# Patient Record
Sex: Female | Born: 1990 | Race: Black or African American | Hispanic: No | Marital: Single
Health system: Southern US, Community
[De-identification: ages and names within clinical notes are randomized; demographics above are authoritative.]

## PROBLEM LIST (undated history)

## (undated) DIAGNOSIS — J45909 Unspecified asthma, uncomplicated: Secondary | ICD-10-CM

## (undated) HISTORY — PX: ANTERIOR CRUCIATE LIGAMENT REPAIR: SHX115

---

## 2013-02-04 ENCOUNTER — Emergency Department (HOSPITAL_COMMUNITY)

## 2013-02-04 ENCOUNTER — Encounter (HOSPITAL_COMMUNITY): Payer: Self-pay | Admitting: Emergency Medicine

## 2013-02-04 ENCOUNTER — Emergency Department (HOSPITAL_COMMUNITY)
Admission: EM | Admit: 2013-02-04 | Discharge: 2013-02-04 | Disposition: A | Discharge status: Home / Self Care | Attending: Emergency Medicine | Admitting: Emergency Medicine

## 2013-02-04 DIAGNOSIS — Z79899 Other long term (current) drug therapy: Secondary | ICD-10-CM | POA: Insufficient documentation

## 2013-02-04 DIAGNOSIS — Z3202 Encounter for pregnancy test, result negative: Secondary | ICD-10-CM | POA: Insufficient documentation

## 2013-02-04 DIAGNOSIS — J45909 Unspecified asthma, uncomplicated: Secondary | ICD-10-CM | POA: Insufficient documentation

## 2013-02-04 DIAGNOSIS — R192 Visible peristalsis: Secondary | ICD-10-CM

## 2013-02-04 DIAGNOSIS — R109 Unspecified abdominal pain: Secondary | ICD-10-CM | POA: Insufficient documentation

## 2013-02-04 HISTORY — DX: Unspecified asthma, uncomplicated: J45.909

## 2013-02-04 LAB — URINALYSIS, ROUTINE W REFLEX MICROSCOPIC
Bilirubin Urine: NEGATIVE
Glucose, UA: NEGATIVE mg/dL
Nitrite: NEGATIVE
Protein, ur: NEGATIVE mg/dL
Specific Gravity, Urine: 1.01 (ref 1.005–1.030)
pH: 6 (ref 5.0–8.0)

## 2013-02-04 LAB — URINE MICROSCOPIC-ADD ON

## 2013-02-04 MED ORDER — DICYCLOMINE HCL 10 MG PO CAPS
20.0000 mg | ORAL_CAPSULE | Freq: Once | ORAL | Status: AC
  Administered 2013-02-04: 20 mg via ORAL
  Filled 2013-02-04: qty 2

## 2013-02-04 MED ORDER — DICYCLOMINE HCL 20 MG PO TABS: 20.0000 mg | ORAL_TABLET | Freq: Four times a day (QID) | ORAL | Status: AC | PRN

## 2013-02-04 NOTE — ED Notes (Signed)
Per pt, she feels like something is moving around in her stomach.  Sts this started last night.  Denies nvd, sob, abdominal pain but reports cramping.  Sts she has not been passing gas as much.

## 2013-02-04 NOTE — ED Provider Notes (Signed)
CSN: [id number]     Arrival date & time 02/04/13  0330 History   First MD Initiated Contact with Patient 02/04/13 [phone]     Chief Complaint  Patient presents with  . Abdominal Cramping   (Consider location/radiation/quality/duration/timing/severity/associated sxs/prior Treatment) HPI 22 yo female presents to the ER from home with complaint of the sensation of something crawling in her stomach, and mild abdominal cramping.  No urinary symptoms, no vaginal discharge, no n/v/d.  Normal bm yesterday.  No prior abdominal surgeries.  No travel.  LMP one week ago Past Medical History  Diagnosis Date  . Asthma    Past Surgical History  Procedure Laterality Date  . Anterior cruciate ligament repair     History reviewed. No pertinent family history. History  Substance Use Topics  . Smoking status: Unknown If Ever Smoked  . Smokeless tobacco: Not on file  . Alcohol Use: No   OB History   Grav Para Term Preterm Abortions TAB SAB Ect Mult Living                 Review of Systems  All other systems reviewed and are negative.    Allergies  Shellfish allergy  Home Medications   Current Outpatient Rx  Name  Route  Sig  Dispense  Refill  . diphenhydrAMINE (BENADRYL) 25 MG tablet   Oral   Take 25 mg by mouth every 6 (six) hours as needed for itching.         Marland Kitchen PROAIR HFA 108 (90 BASE) MCG/ACT inhaler   Inhalation   Inhale 1 puff into the lungs every 6 (six) hours as needed.           BP 158/101  Pulse 105  Temp(Src) 98.9 F (37.2 C) (Oral)  Resp 18  SpO2 99%  LMP 01/28/2013 Physical Exam  Nursing note and vitals reviewed. Constitutional: She is oriented to person, place, and time. She appears well-developed and well-nourished.  HENT:  Head: Normocephalic and atraumatic.  Nose: Nose normal.  Mouth/Throat: Oropharynx is clear and moist.  Eyes: Conjunctivae and EOM are normal. Pupils are equal, round, and reactive to light.  Neck: Normal range of motion. Neck supple.  No JVD present. No tracheal deviation present. No thyromegaly present.  Cardiovascular: Normal rate, regular rhythm, normal heart sounds and intact distal pulses.  Exam reveals no gallop and no friction rub.   No murmur heard. Pulmonary/Chest: Effort normal and breath sounds normal. No stridor. No respiratory distress. She has no wheezes. She has no rales. She exhibits no tenderness.  Abdominal: Soft. She exhibits no distension and no mass. There is no tenderness. There is no rebound and no guarding.  Mildly hyperactive bowel sounds  Musculoskeletal: Normal range of motion. She exhibits no edema and no tenderness.  Lymphadenopathy:    She has no cervical adenopathy.  Neurological: She is alert and oriented to person, place, and time. She exhibits normal muscle tone. Coordination normal.  Skin: Skin is warm and dry. No rash noted. No erythema. No pallor.  Psychiatric: She has a normal mood and affect. Her behavior is normal. Judgment and thought content normal.    ED Course  Procedures (including critical care time) Labs Review Labs Reviewed  URINALYSIS, ROUTINE W REFLEX MICROSCOPIC - Abnormal; Notable for the following:    APPearance CLOUDY (*)    Hgb urine dipstick TRACE (*)    Leukocytes, UA TRACE (*)    All other components within normal limits  URINE MICROSCOPIC-ADD ON - Abnormal;  Notable for the following:    Squamous Epithelial / LPF FEW (*)    Bacteria, UA FEW (*)    All other components within normal limits  URINE CULTURE  POCT PREGNANCY, URINE   Imaging Review Dg Abd 1 View  02/04/2013   CLINICAL DATA:  Abdominal cramping.  EXAM: ABDOMEN - 1 VIEW  COMPARISON:  None.  FINDINGS: The bowel gas pattern is normal. No radio-opaque calculi or other significant radiographic abnormality are seen.  IMPRESSION: Negative.   Electronically Signed   By: Burman Nieves M.D.   On: 02/04/2013 05:15    EKG Interpretation   None       MDM   1. Abdominal cramping   2. Increased  peristalsis    22 yo female with abdominal cramping, and unusual sensation of crawling.  Will try bentyl, check kub for obstruction/constipation.    Olivia Mackie, MD 02/04/13 [phone]

## 2013-02-05 LAB — URINE CULTURE
Colony Count: NO GROWTH
Culture: NO GROWTH

## 2013-07-15 ENCOUNTER — Emergency Department (INDEPENDENT_AMBULATORY_CARE_PROVIDER_SITE_OTHER)
Admission: EM | Admit: 2013-07-15 | Discharge: 2013-07-15 | Disposition: A | Discharge status: Home / Self Care | Payer: PRIVATE HEALTH INSURANCE | Source: Home / Self Care

## 2013-07-15 ENCOUNTER — Encounter (HOSPITAL_COMMUNITY): Payer: Self-pay | Admitting: Emergency Medicine

## 2013-07-15 DIAGNOSIS — T44905A Adverse effect of unspecified drugs primarily affecting the autonomic nervous system, initial encounter: Secondary | ICD-10-CM

## 2013-07-15 DIAGNOSIS — J45901 Unspecified asthma with (acute) exacerbation: Secondary | ICD-10-CM

## 2013-07-15 MED ORDER — TRIAMCINOLONE ACETONIDE 40 MG/ML IJ SUSP
60.0000 mg | Freq: Once | INTRAMUSCULAR | Status: AC
  Administered 2013-07-15: 60 mg via INTRAMUSCULAR

## 2013-07-15 MED ORDER — ALBUTEROL SULFATE (2.5 MG/3ML) 0.083% IN NEBU
INHALATION_SOLUTION | RESPIRATORY_TRACT | Status: AC
  Filled 2013-07-15: qty 3

## 2013-07-15 MED ORDER — ONDANSETRON 4 MG PO TBDP
ORAL_TABLET | ORAL | Status: AC
  Filled 2013-07-15: qty 1

## 2013-07-15 MED ORDER — ONDANSETRON HCL 4 MG PO TABS: 4.0000 mg | ORAL_TABLET | Freq: Four times a day (QID) | ORAL | Status: AC

## 2013-07-15 MED ORDER — TRIAMCINOLONE ACETONIDE 40 MG/ML IJ SUSP
INTRAMUSCULAR | Status: AC
  Filled 2013-07-15: qty 1

## 2013-07-15 MED ORDER — IPRATROPIUM-ALBUTEROL 0.5-2.5 (3) MG/3ML IN SOLN
3.0000 mL | Freq: Once | RESPIRATORY_TRACT | Status: AC
  Administered 2013-07-15: 3 mL via RESPIRATORY_TRACT

## 2013-07-15 MED ORDER — IPRATROPIUM BROMIDE 0.02 % IN SOLN
RESPIRATORY_TRACT | Status: AC
  Filled 2013-07-15: qty 2.5

## 2013-07-15 MED ORDER — ONDANSETRON 4 MG PO TBDP
4.0000 mg | ORAL_TABLET | Freq: Once | ORAL | Status: AC
  Administered 2013-07-15: 4 mg via ORAL

## 2013-07-15 MED ORDER — METHYLPREDNISOLONE 4 MG PO KIT: PACK | ORAL | Status: AC

## 2013-07-15 MED ORDER — BECLOMETHASONE DIPROPIONATE 80 MCG/ACT IN AERS: 1.0000 | INHALATION_SPRAY | Freq: Two times a day (BID) | RESPIRATORY_TRACT | Status: DC

## 2013-07-15 NOTE — ED Notes (Signed)
Asked to assess this patient on arrival . NAD, reported asthma flare up, chest sore

## 2013-07-15 NOTE — ED Provider Notes (Signed)
CSN: [id number]     Arrival date & time 07/15/13  1211 History   First MD Initiated Contact with Patient 07/15/13 1339     Chief Complaint  Patient presents with  . Asthma   (Consider location/radiation/quality/duration/timing/severity/associated sxs/prior Treatment) HPI Comments: 23 Y O F with hx of asthma experiencing onset of cough yesterday. Using Albuterol HFA too frequently without adequate results. + for chest tightness and headache. This AM with vomiting.  No fever.   Past Medical History  Diagnosis Date  . Asthma    Past Surgical History  Procedure Laterality Date  . Anterior cruciate ligament repair     No family history on file. History  Substance Use Topics  . Smoking status: Unknown If Ever Smoked  . Smokeless tobacco: Not on file  . Alcohol Use: No   OB History   Grav Para Term Preterm Abortions TAB SAB Ect Mult Living                 Review of Systems  Constitutional: Positive for activity change. Negative for fever.  HENT: Negative for sore throat.   Eyes: Negative.   Respiratory: Positive for cough, chest tightness, shortness of breath and wheezing.   Cardiovascular: Negative for chest pain, palpitations and leg swelling.  Gastrointestinal: Positive for nausea and vomiting.  Musculoskeletal: Negative.   Neurological: Negative.     Allergies  Shellfish allergy  Home Medications   Current Outpatient Rx  Name  Route  Sig  Dispense  Refill  . beclomethasone (QVAR) 80 MCG/ACT inhaler   Inhalation   Inhale 1 puff into the lungs 2 (two) times daily.   1 Inhaler   12   . dicyclomine (BENTYL) 20 MG tablet   Oral   Take 1 tablet (20 mg total) by mouth every 6 (six) hours as needed (for abdominal cramping).   20 tablet   0   . diphenhydrAMINE (BENADRYL) 25 MG tablet   Oral   Take 25 mg by mouth every 6 (six) hours as needed for itching.         . methylPREDNISolone (MEDROL DOSEPAK) 4 MG tablet      follow package directions   21 tablet   0   . ondansetron (ZOFRAN) 4 MG tablet   Oral   Take 1 tablet (4 mg total) by mouth every 6 (six) hours.   12 tablet   0   . PROAIR HFA 108 (90 BASE) MCG/ACT inhaler   Inhalation   Inhale 1 puff into the lungs every 6 (six) hours as needed.           BP 138/98  Pulse 80  Temp(Src) 98.8 F (37.1 C) (Oral)  Resp 16  SpO2 96% Physical Exam  Nursing note and vitals reviewed. Constitutional: She is oriented to person, place, and time. She appears well-developed and well-nourished. No distress.  HENT:  Left Ear: External ear normal.  Mouth/Throat: No oropharyngeal exudate.  Minor erythema of OP  Eyes: Conjunctivae and EOM are normal.  Neck: Normal range of motion. Neck supple.  Cardiovascular: Normal rate, regular rhythm and normal heart sounds.   Pulmonary/Chest: She has no rales.  Mild increase in resp effort. I and out wheezes bilat.  Pron Longed expiratory phase.  Musculoskeletal: She exhibits no edema.  Lymphadenopathy:    She has no cervical adenopathy.  Neurological: She is alert and oriented to person, place, and time.  Skin: Skin is warm and dry.  Psychiatric: She has a normal mood and  affect.    ED Course  Procedures (including critical care time) Labs Review Labs Reviewed - No data to display Imaging Review No results found.   MDM   1. Asthma exacerbation   2. 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  Duoneb. Post Duoneb breathing much better. Auscultation- no wheezing, good air movement, much improved. Kenalog 60 mg IM zofran 4 mg p o now and rx Medrol dose pack start tomorrow Start claritin or allegra 180 mg IM  Hayden Rasmussenavid Aliyanah Rozas, NP 07/15/13 1450

## 2013-07-15 NOTE — ED Notes (Signed)
Reported recurrence of cough, body aches, "flu like symptoms" since 4-2. C/o hurts to breathe. Reportedly vomited x 2 since arrival (not seen) no wheezing on observation . Initial SAO2 100 % on room air

## 2013-07-15 NOTE — Discharge Instructions (Signed)
Asthma, Acute Bronchospasm °Acute bronchospasm caused by asthma is also referred to as an asthma attack. Bronchospasm means your air passages become narrowed. The narrowing is caused by inflammation and tightening of the muscles in the air tubes (bronchi) in your lungs. This can make it hard to breath or cause you to wheeze and cough. °CAUSES °Possible triggers are: °· Animal dander from the skin, hair, or feathers of animals. °· Dust mites contained in house dust. °· Cockroaches. °· Pollen from trees or grass. °· Mold. °· Cigarette or tobacco smoke. °· Air pollutants such as dust, household cleaners, hair sprays, aerosol sprays, paint fumes, strong chemicals, or strong odors. °· Cold air or weather changes. Cold air may trigger inflammation. Winds increase molds and pollens in the air. °· Strong emotions such as crying or laughing hard. °· Stress. °· Certain medicines such as aspirin or beta-blockers. °· Sulfites in foods and drinks, such as dried fruits and wine. °· Infections or inflammatory conditions, such as a flu, cold, or inflammation of the nasal membranes (rhinitis). °· Gastroesophageal reflux disease (GERD). GERD is a condition where stomach acid backs up into your throat (esophagus). °· Exercise or strenuous activity. °SIGNS AND SYMPTOMS  °· Wheezing. °· Excessive coughing, particularly at night. °· Chest tightness. °· Shortness of breath. °DIAGNOSIS  °Your health care provider will ask you about your medical history and perform a physical exam. A chest X-ray or blood testing may be performed to look for other causes of your symptoms or other conditions that may have triggered your asthma attack.  °TREATMENT  °Treatment is aimed at reducing inflammation and opening up the airways in your lungs.  Most asthma attacks are treated with inhaled medicines. These include quick relief or rescue medicines (such as bronchodilators) and controller medicines (such as inhaled corticosteroids). These medicines are  sometimes given through an inhaler or a nebulizer. Systemic steroid medicine taken by mouth or given through an IV tube also can be used to reduce the inflammation when an attack is moderate or severe. Antibiotic medicines are only used if a bacterial infection is present.  °HOME CARE INSTRUCTIONS  °· Rest. °· Drink plenty of liquids. This helps the mucus to remain thin and be easily coughed up. Only use caffeine in moderation and do not use alcohol until you have recovered from your illness. °· Do not smoke. Avoid being exposed to secondhand smoke. °· You play a critical role in keeping yourself in good health. Avoid exposure to things that cause you to wheeze or to have breathing problems. °· Keep your medicines up to date and available. Carefully follow your health care provider's treatment plan. °· Take your medicine exactly as prescribed. °· When pollen or pollution is bad, keep windows closed and use an air conditioner or go to places with air conditioning. °· Asthma requires careful medical care. See your health care provider for a follow-up as advised. If you are more than [redacted] weeks pregnant and you were prescribed any new medicines, let your obstetrician know about the visit and how you are doing. Follow-up with your health care provider as directed. °· After you have recovered from your asthma attack, make an appointment with your outpatient doctor to talk about ways to reduce the likelihood of future attacks. If you do not have a doctor who manages your asthma, make an appointment with a primary care doctor to discuss your asthma. °SEEK IMMEDIATE MEDICAL CARE IF:  °· You are getting worse. °· You have trouble breathing. If severe, call   your local emergency services (911 in the [place]  You develop chest pain or discomfort.  You are vomiting.  You are not able to keep fluids down.  You are coughing up yellow, green, brown, or bloody sputum.  You have a fever and your symptoms suddenly get  worse.  You have trouble swallowing. MAKE SURE YOU:   Understand these instructions.  Will watch your condition.  Will get help right away if you are not doing well or get worse. Document Released: 07/16/2006 Document Revised: 12/01/2012 Document Reviewed: 10/06/2012 [facility] Patient Information 2014 [place], [place].  Asthma Attack Prevention Although there is no way to prevent asthma from starting, you can take steps to control the disease and reduce its symptoms. Learn about your asthma and how to control it. Take an active role to control your asthma by working with your health care provider to create and follow an asthma action plan. An asthma action plan guides you in:  Taking your medicines properly.  Avoiding things that set off your asthma or make your asthma worse (asthma triggers).  Tracking your level of asthma control.  Responding to worsening asthma.  Seeking emergency care when needed. To track your asthma, keep records of your symptoms, check your peak flow number using a handheld device that shows how well air moves out of your lungs (peak flow meter), and get regular asthma checkups.  WHAT ARE SOME WAYS TO PREVENT AN ASTHMA ATTACK?  Take medicines as directed by your health care provider.  Keep track of your asthma symptoms and level of control.  With your health care provider, write a detailed plan for taking medicines and managing an asthma attack. Then be sure to follow your action plan. Asthma is an ongoing condition that needs regular monitoring and treatment.  Identify and avoid asthma triggers. Many outdoor allergens and irritants (such as pollen, mold, cold air, and air pollution) can trigger asthma attacks. Find out what your asthma triggers are and take steps to avoid them.  Monitor your breathing. Learn to recognize warning signs of an attack, such as coughing, wheezing, or shortness of breath. Your lung function may decrease before you notice any signs  or symptoms, so regularly measure and record your peak airflow with a home peak flow meter.  Identify and treat attacks early. If you act quickly, you are less likely to have a severe attack. You will also need less medicine to control your symptoms. When your peak flow measurements decrease and alert you to an upcoming attack, take your medicine as instructed and immediately stop any activity that may have triggered the attack. If your symptoms do not improve, get medical help.  Pay attention to increasing quick-relief inhaler use. If you find yourself relying on your quick-relief inhaler, your asthma is not under control. See your health care provider about adjusting your treatment. WHAT CAN MAKE MY SYMPTOMS WORSE? A number of common things can set off or make your asthma symptoms worse and cause temporary increased inflammation of your airways. Keep track of your asthma symptoms for several weeks, detailing all the environmental and emotional factors that are linked with your asthma. When you have an asthma attack, go back to your asthma diary to see which factor, or combination of factors, might have contributed to it. Once you know what these factors are, you can take steps to control many of them. If you have allergies and asthma, it is important to take asthma prevention steps at home. Minimizing contact with the  substance to which you are allergic will help prevent an asthma attack. Some triggers and ways to avoid these triggers are: Animal Dander:  Some people are allergic to the flakes of skin or dried saliva from animals with fur or feathers.   There is no such thing as a hypoallergenic dog or cat breed. All dogs or cats can cause allergies, even if they don't shed.  Keep these pets out of your home.  If you are not able to keep a pet outdoors, keep the pet out of your bedroom and other sleeping areas at all times, and keep the door closed.  Remove carpets and furniture covered with cloth  from your home. If that is not possible, keep the pet away from fabric-covered furniture and carpets. Dust Mites: Many people with asthma are allergic to dust mites. Dust mites are tiny bugs that are found in every home in mattresses, pillows, carpets, fabric-covered furniture, bedcovers, clothes, stuffed toys, and other fabric-covered items.   Cover your mattress in a special dust-proof cover.  Cover your pillow in a special dust-proof cover, or wash the pillow each week in hot water. Water must be hotter than 130 F (54.4 C) to kill dust mites. Cold or warm water used with detergent and bleach can also be effective.  Wash the sheets and blankets on your bed each week in hot water.  Try not to sleep or lie on cloth-covered cushions.  Call ahead when traveling and ask for a smoke-free hotel room. Bring your own bedding and pillows in case the hotel only supplies feather pillows and down comforters, which may contain dust mites and cause asthma symptoms.  Remove carpets from your bedroom and those laid on concrete, if you can.  Keep stuffed toys out of the bed, or wash the toys weekly in hot water or cooler water with detergent and bleach. Cockroaches: Many people with asthma are allergic to the droppings and remains of cockroaches.   Keep food and garbage in closed containers. Never leave food out.  Use poison baits, traps, powders, gels, or paste (for example, boric acid).  If a spray is used to kill cockroaches, stay out of the room until the odor goes away. Indoor Mold:  Fix leaky faucets, pipes, or other sources of water that have mold around them.  Clean floors and moldy surfaces with a fungicide or diluted bleach.  Avoid using humidifiers, vaporizers, or swamp coolers. These can spread molds through the air. Pollen and Outdoor Mold:  When pollen or mold spore counts are high, try to keep your windows closed.  Stay indoors with windows closed from late morning to afternoon.  Pollen and some mold spore counts are highest at that time.  Ask your health care provider whether you need to take anti-inflammatory medicine or increase your dose of the medicine before your allergy season starts. Other Irritants to Avoid:  Tobacco smoke is an irritant. If you smoke, ask your health care provider how you can quit. Ask family members to quit smoking too. Do not allow smoking in your home or car.  If possible, do not use a wood-burning stove, kerosene heater, or fireplace. Minimize exposure to all sources of smoke, including to incense, candles, fires, and fireworks.  Try to stay away from strong odors and sprays, such as perfume, talcum powder, hair spray, and paints.  Decrease humidity in your home and use an indoor air cleaning device. Reduce indoor humidity to below 60%. Dehumidifiers or central air conditioners can  do this.  Decrease house dust exposure by changing furnace and air cooler filters frequently.  Try to have someone else vacuum for you once or twice a week. Stay out of rooms while they are being vacuumed and for a short while afterward.  If you vacuum, use a dust mask from a hardware store, a double-layered or microfilter vacuum cleaner bag, or a vacuum cleaner with a HEPA filter.  Sulfites in foods and beverages can be irritants. Do not drink beer or wine or eat dried fruit, processed potatoes, or shrimp if they cause asthma symptoms.  Cold air can trigger an asthma attack. Cover your nose and mouth with a scarf on cold or windy days.  Several health conditions can make asthma more difficult to manage, including a runny nose, sinus infections, reflux disease, psychological stress, and sleep apnea. Work with your health care provider to manage these conditions.  Avoid close contact with people who have a respiratory infection such as a cold or the flu, since your asthma symptoms may get worse if you catch the infection. Wash your hands thoroughly after  touching items that may have been handled by people with a respiratory infection.  Get a flu shot every year to protect against the flu virus, which often makes asthma worse for days or weeks. Also get a pneumonia shot if you have not previously had one. Unlike the flu shot, the pneumonia shot does not need to be given yearly. Medicines:  Talk to your health care provider about whether it is safe for you to take aspirin or non-steroidal anti-inflammatory medicines (NSAIDs). In a small number of people with asthma, aspirin and NSAIDs can cause asthma attacks. These medicines must be avoided by people who have known aspirin-sensitive asthma. It is important that people with aspirin-sensitive asthma read labels of all over-the-counter medicines used to treat pain, colds, coughs, and fever.  Beta blockers and ACE inhibitors are other medicines you should discuss with your health care provider. HOW CAN I FIND OUT WHAT I AM ALLERGIC TO? Ask your asthma health care provider about allergy skin testing or blood testing (the RAST test) to identify the allergens to which you are sensitive. If you are found to have allergies, the most important thing to do is to try to avoid exposure to any allergens that you are sensitive to as much as possible. Other treatments for allergies, such as medicines and allergy shots (immunotherapy) are available.  CAN I EXERCISE? Follow your health care provider's advice regarding asthma treatment before exercising. It is important to maintain a regular exercise program, but vigorous exercise, or exercise in cold, humid, or dry environments can cause asthma attacks, especially for those people who have exercise-induced asthma. Document Released: 03/19/2009 Document Revised: 12/01/2012 Document Reviewed: 10/06/2012 [facility] Patient Information 2014 [place], [place]

## 2013-07-15 NOTE — ED Provider Notes (Signed)
Medical screening examination/treatment/procedure(s) were performed by non-physician practitioner and as supervising physician I was immediately available for consultation/collaboration.  Leslee Homeavid Torrez Renfroe, M.D.  Reuben Likesavid C Timya Trimmer, MD 07/15/13 2139

## 2014-05-16 ENCOUNTER — Emergency Department (INDEPENDENT_AMBULATORY_CARE_PROVIDER_SITE_OTHER)
Admission: EM | Admit: 2014-05-16 | Discharge: 2014-05-16 | Disposition: A | Discharge status: Home / Self Care | Payer: PRIVATE HEALTH INSURANCE | Source: Home / Self Care | Attending: Family Medicine | Admitting: Family Medicine

## 2014-05-16 ENCOUNTER — Encounter (HOSPITAL_COMMUNITY): Payer: Self-pay | Admitting: *Deleted

## 2014-05-16 DIAGNOSIS — J45909 Unspecified asthma, uncomplicated: Secondary | ICD-10-CM

## 2014-05-16 MED ORDER — IPRATROPIUM BROMIDE 0.02 % IN SOLN
0.5000 mg | Freq: Once | RESPIRATORY_TRACT | Status: AC
  Administered 2014-05-16: 0.5 mg via RESPIRATORY_TRACT

## 2014-05-16 MED ORDER — BECLOMETHASONE DIPROPIONATE 80 MCG/ACT IN AERS: 1.0000 | INHALATION_SPRAY | Freq: Two times a day (BID) | RESPIRATORY_TRACT | Status: AC

## 2014-05-16 MED ORDER — METHYLPREDNISOLONE SODIUM SUCC 125 MG IJ SOLR
INTRAMUSCULAR | Status: AC
  Filled 2014-05-16: qty 2

## 2014-05-16 MED ORDER — ALBUTEROL SULFATE (2.5 MG/3ML) 0.083% IN NEBU
INHALATION_SOLUTION | RESPIRATORY_TRACT | Status: AC
  Filled 2014-05-16: qty 6

## 2014-05-16 MED ORDER — ALBUTEROL SULFATE HFA 108 (90 BASE) MCG/ACT IN AERS: 2.0000 | INHALATION_SPRAY | Freq: Four times a day (QID) | RESPIRATORY_TRACT | Status: AC | PRN

## 2014-05-16 MED ORDER — IPRATROPIUM BROMIDE 0.02 % IN SOLN
RESPIRATORY_TRACT | Status: AC
  Filled 2014-05-16: qty 2.5

## 2014-05-16 MED ORDER — ALBUTEROL SULFATE (2.5 MG/3ML) 0.083% IN NEBU
5.0000 mg | INHALATION_SOLUTION | Freq: Once | RESPIRATORY_TRACT | Status: AC
  Administered 2014-05-16: 5 mg via RESPIRATORY_TRACT

## 2014-05-16 MED ORDER — METHYLPREDNISOLONE SODIUM SUCC 125 MG IJ SOLR
125.0000 mg | Freq: Once | INTRAMUSCULAR | Status: AC
  Administered 2014-05-16: 125 mg via INTRAMUSCULAR

## 2014-05-16 NOTE — Discharge Instructions (Signed)
See asthma doctor as soon as possible for further asthma care.

## 2014-05-16 NOTE — ED Provider Notes (Signed)
CSN: [id number]     Arrival date & time 05/16/14  1027 History   First MD Initiated Contact with Patient 05/16/14 1150     Chief Complaint  Patient presents with  . Shortness of Breath   (Consider location/radiation/quality/duration/timing/severity/associated sxs/prior Treatment) Patient is a 24 y.o. female presenting with shortness of breath. The history is provided by the patient.  Shortness of Breath Severity:  Mild Onset quality:  Gradual Duration:  1 month Progression:  Waxing and waning Chronicity:  Chronic Context: weather changes   Context comment:  [place] going to college here with recent flare of asthma, going thru 2 inhalers recently. Associated symptoms: cough and wheezing   Associated symptoms: no abdominal pain, no chest pain and no fever   Risk factors: no tobacco use     Past Medical History  Diagnosis Date  . Asthma    Past Surgical History  Procedure Laterality Date  . Anterior cruciate ligament repair     History reviewed. No pertinent family history. History  Substance Use Topics  . Smoking status: Unknown If Ever Smoked  . Smokeless tobacco: Not on file  . Alcohol Use: No   OB History    No data available     Review of Systems  Constitutional: Negative.  Negative for fever.  HENT: Negative.   Respiratory: Positive for cough, shortness of breath and wheezing.   Cardiovascular: Negative for chest pain.  Gastrointestinal: Negative for abdominal pain.  0  Allergies  Shellfish allergy  Home Medications   Prior to Admission medications   Medication Sig Start Date End Date Taking? Authorizing Provider  albuterol (PROVENTIL HFA;VENTOLIN HFA) 108 (90 BASE) MCG/ACT inhaler Inhale 2 puffs into the lungs every 6 (six) hours as needed for wheezing or shortness of breath. 05/16/14   Linna HoffJames D Arkel Cartwright, MD  beclomethasone (QVAR) 80 MCG/ACT inhaler Inhale 1 puff into the lungs 2 (two) times daily. 05/16/14   Linna HoffJames D Randilyn Foisy, MD  dicyclomine (BENTYL) 20 MG  tablet Take 1 tablet (20 mg total) by mouth every 6 (six) hours as needed (for abdominal cramping). 02/04/13   Olivia Mackielga M Otter, MD  diphenhydrAMINE (BENADRYL) 25 MG tablet Take 25 mg by mouth every 6 (six) hours as needed for itching.    Historical Provider, MD  methylPREDNISolone (MEDROL DOSEPAK) 4 MG tablet follow package directions 07/15/13   Hayden Rasmussenavid Mabe, NP  ondansetron (ZOFRAN) 4 MG tablet Take 1 tablet (4 mg total) by mouth every 6 (six) hours. 07/15/13   Hayden Rasmussenavid Mabe, NP   BP 124/78 mmHg  Pulse 78  Temp(Src) 98.6 F (37 C) (Oral)  Resp 20  SpO2 100%  LMP 05/03/2014 Physical Exam  Constitutional: She is oriented to person, place, and time. She appears well-developed and well-nourished. No distress.  HENT:  Mouth/Throat: Oropharynx is clear and moist.  Eyes: Conjunctivae and EOM are normal. Pupils are equal, round, and reactive to light.  Neck: Normal range of motion. Neck supple.  Cardiovascular: Normal rate, normal heart sounds and intact distal pulses.   Pulmonary/Chest: Effort normal and breath sounds normal. She has no wheezes. She has no rales.  Lymphadenopathy:    She has no cervical adenopathy.  Neurological: She is alert and oriented to person, place, and time.  Skin: Skin is warm and dry.  Nursing note and vitals reviewed.   ED Course  Procedures (including critical care time) Labs Review Labs Reviewed - No data to display  Imaging Review No results found.   MDM   1.  Asthma due to environmental allergies       Linna Hoff, MD 05/16/14 [phone]

## 2014-05-16 NOTE — ED Notes (Signed)
[facility]

## 2014-05-16 NOTE — ED Notes (Signed)
Pt  Reports  Symptoms  Of  Shortness  Of  Breath           Cough  /  Congested        With a  History  Of  Asthma  She  Reports  Her  meds  Are  Not  Working           she  Reports  The  Symptoms  Have  Been  Building  Up for  About  1  Month  But   Is  Worse    Last  Night

## 2014-11-27 IMAGING — CR DG ABDOMEN 1V
1 series · 1 of 1 positions shown · non-contrast
Comparison: None.

CLINICAL DATA: Abdominal cramping.

EXAM:
ABDOMEN - 1 VIEW

[t abdomen supine]
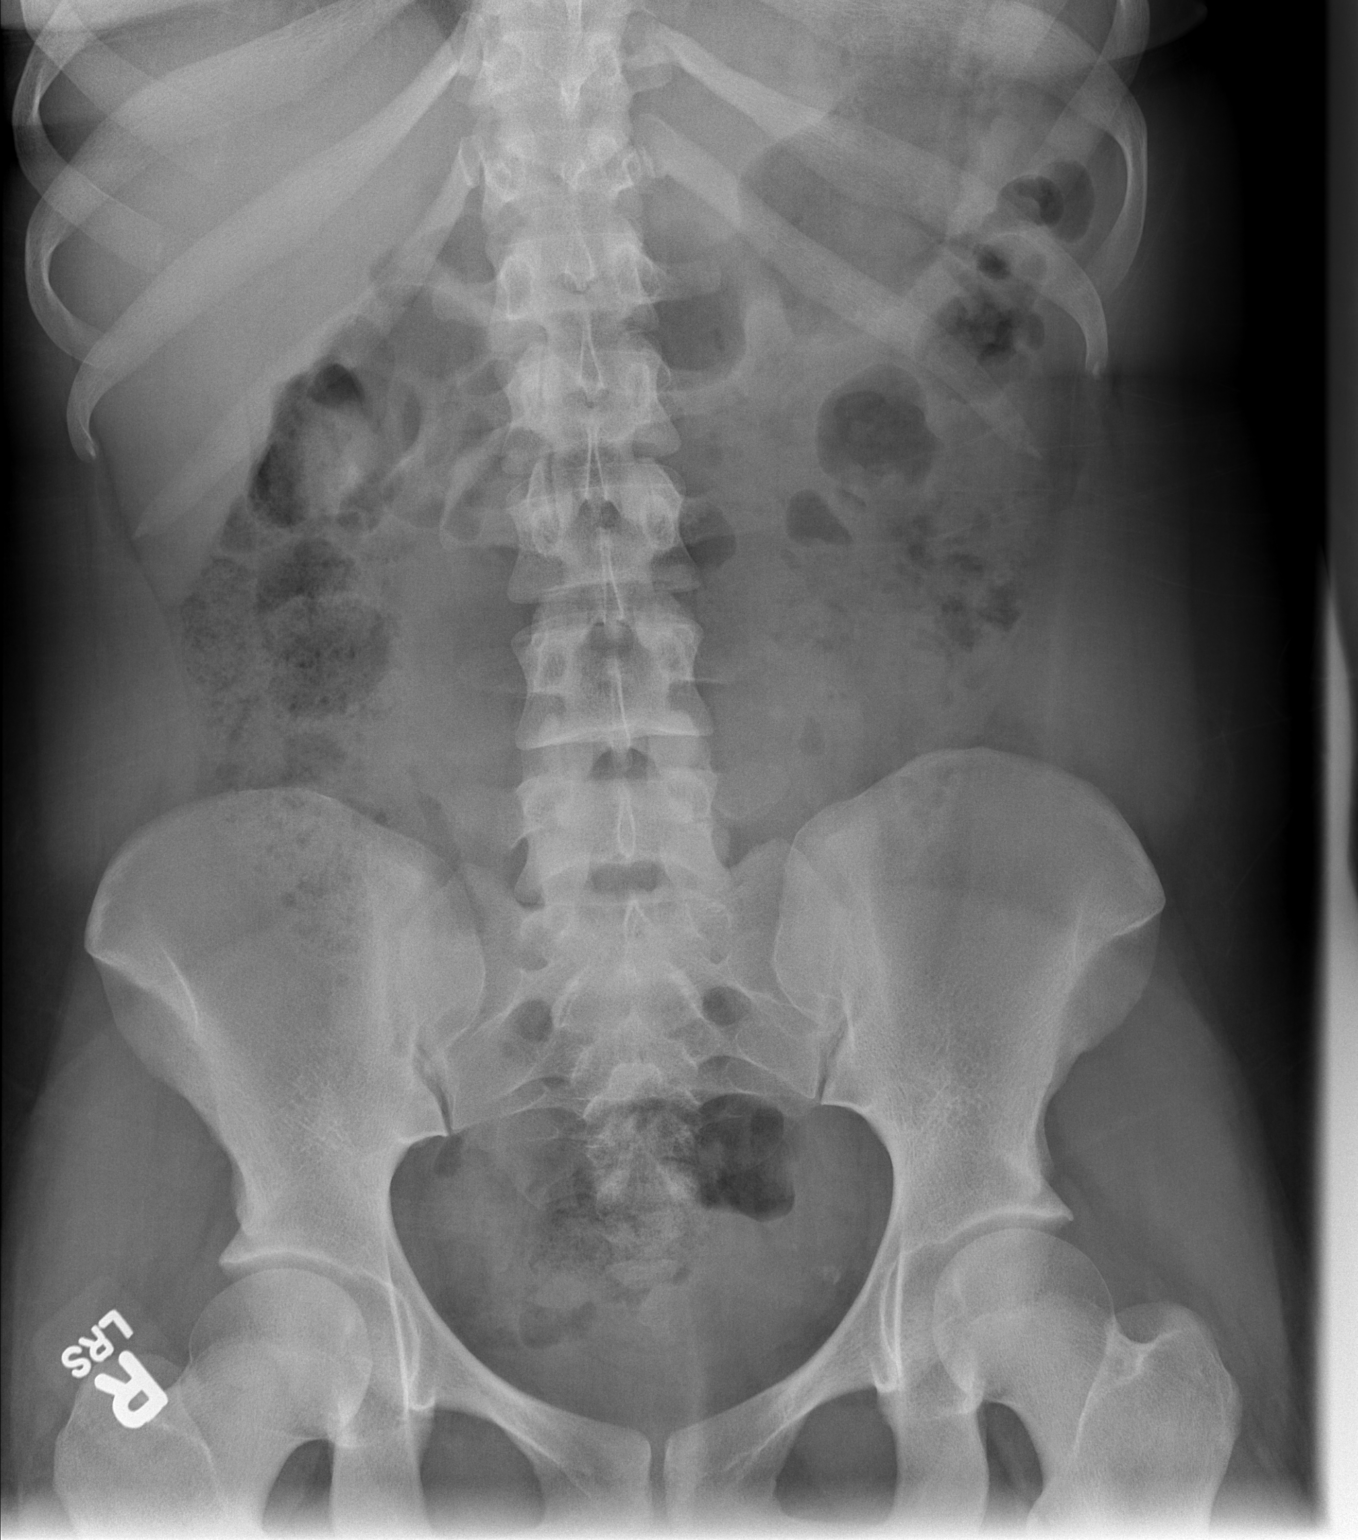

[1 of 1 positions shown; findings below may reference images not displayed]

FINDINGS: The bowel gas pattern is normal. No radio-opaque calculi or other
significant radiographic abnormality are seen.
IMPRESSION: Negative.
